# Patient Record
Sex: Male | Born: 1960 | Race: White | Hispanic: No | Marital: Married
Health system: Southern US, Community
[De-identification: ages and names within clinical notes are randomized; demographics above are authoritative.]

---

## 2009-08-29 ENCOUNTER — Encounter: Admission: RE | Admit: 2009-08-29 | Discharge: 2009-08-29 | Payer: Self-pay | Admitting: Family Medicine

## 2010-10-13 IMAGING — CR DG CHEST 2V
2 series · 2 of 2 positions shown · non-contrast
Comparison: None

CLINICAL DATA: Cough.

CHEST - 2 VIEW

[w chest pa]
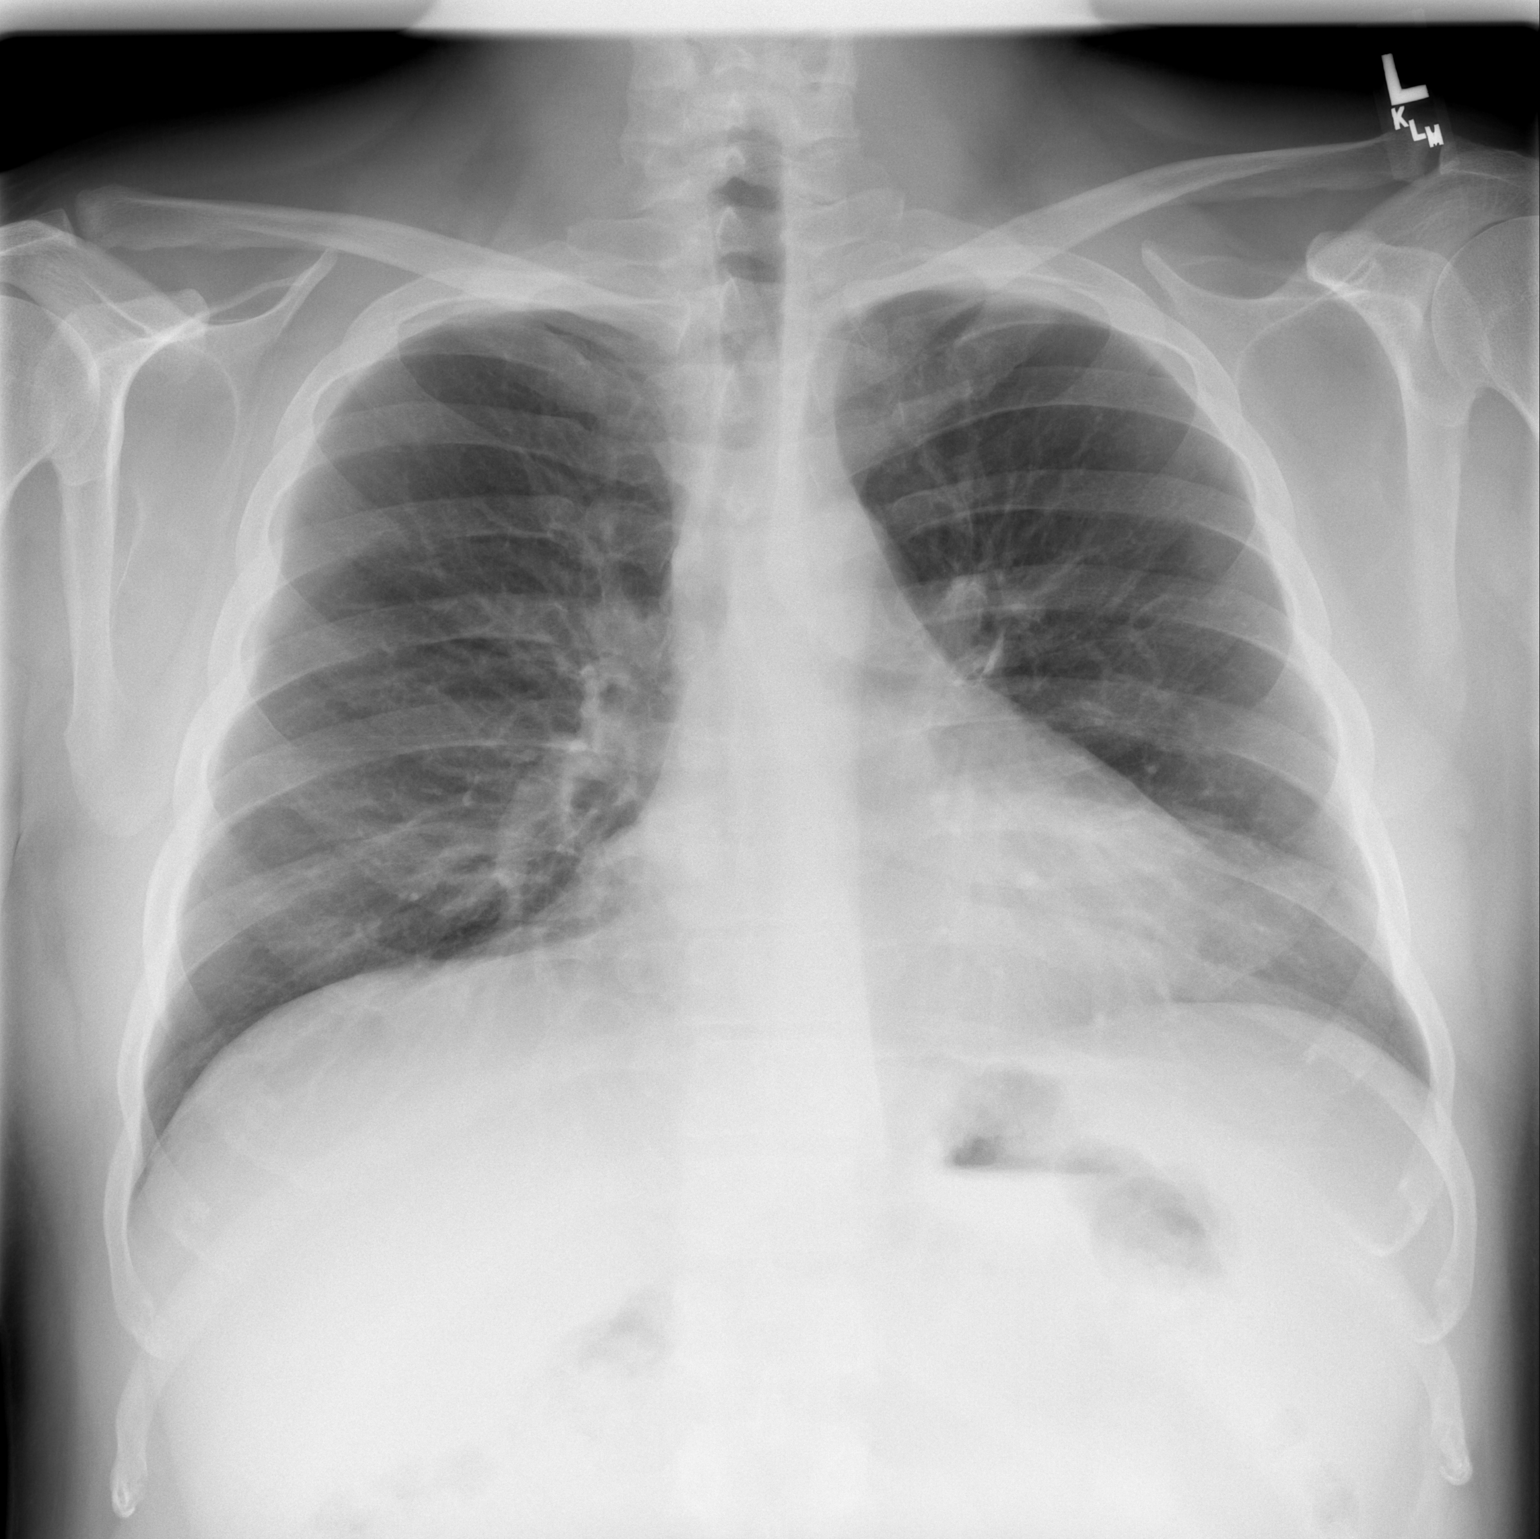

[w chest lat]
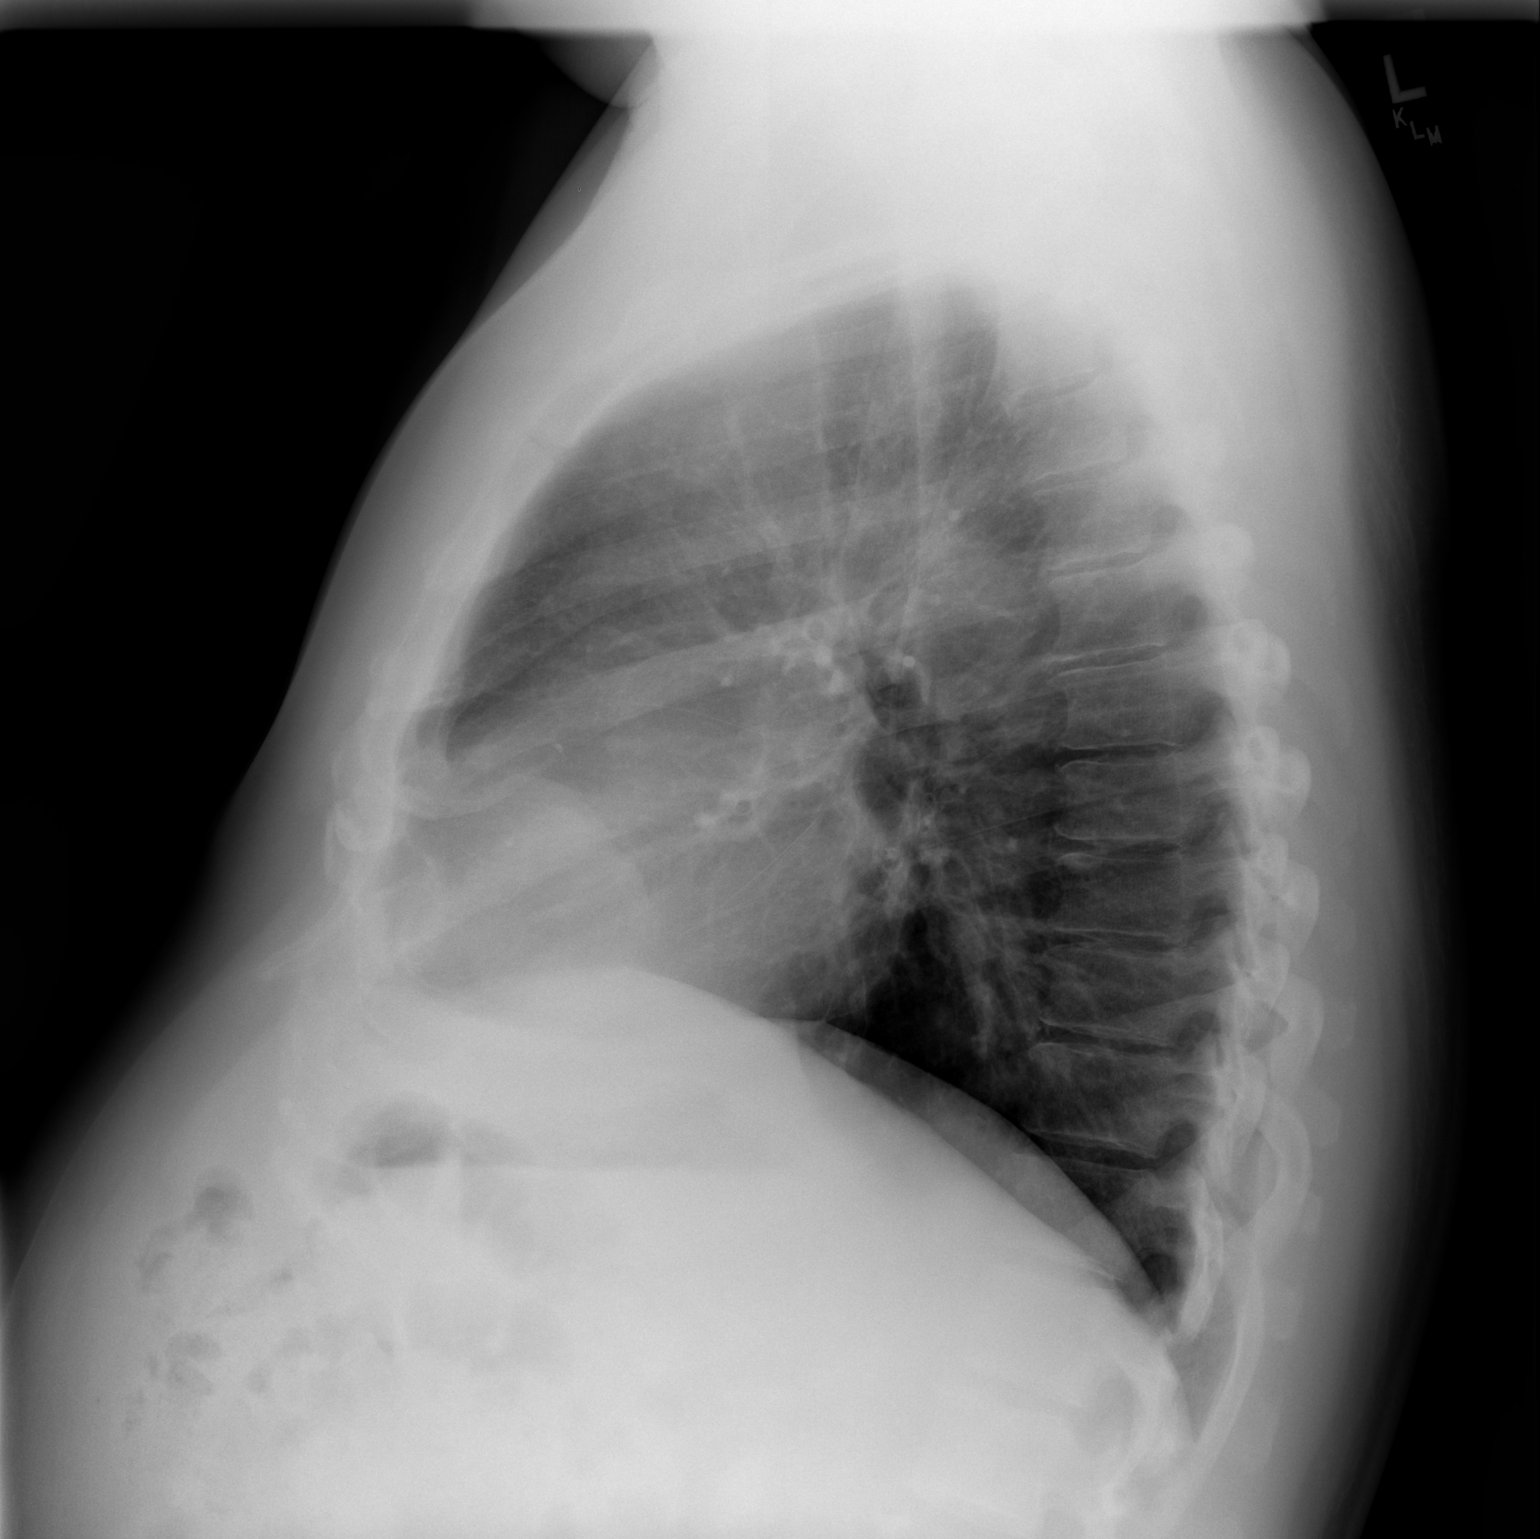

[2 of 2 positions shown; findings below may reference images not displayed]

FINDINGS: Heart is upper limits normal in size.  No confluent
airspace opacities in the lungs.  No effusions or acute bony
abnormality.
IMPRESSION: No acute cardiopulmonary disease.

## 2015-05-16 ENCOUNTER — Other Ambulatory Visit: Payer: Self-pay | Admitting: Family Medicine

## 2015-05-16 ENCOUNTER — Ambulatory Visit
Admission: RE | Admit: 2015-05-16 | Discharge: 2015-05-16 | Disposition: A | Discharge status: Home / Self Care | Payer: 59 | Source: Ambulatory Visit | Attending: Family Medicine | Admitting: Family Medicine

## 2015-05-16 DIAGNOSIS — R059 Cough, unspecified: Secondary | ICD-10-CM

## 2015-05-16 DIAGNOSIS — R05 Cough: Secondary | ICD-10-CM

## 2016-02-25 ENCOUNTER — Encounter: Payer: 59 | Admitting: Podiatry

## 2016-05-01 NOTE — Progress Notes (Signed)
This encounter was created in error - please disregard.

## 2016-06-29 IMAGING — CR DG CHEST 2V
2 series · 2 of 2 positions shown · non-contrast
Comparison: 08/29/2009

CLINICAL DATA: Cough, 2 months duration. Wheezing and chest
tightness.

EXAM:
CHEST  2 VIEW

[w chest pa]
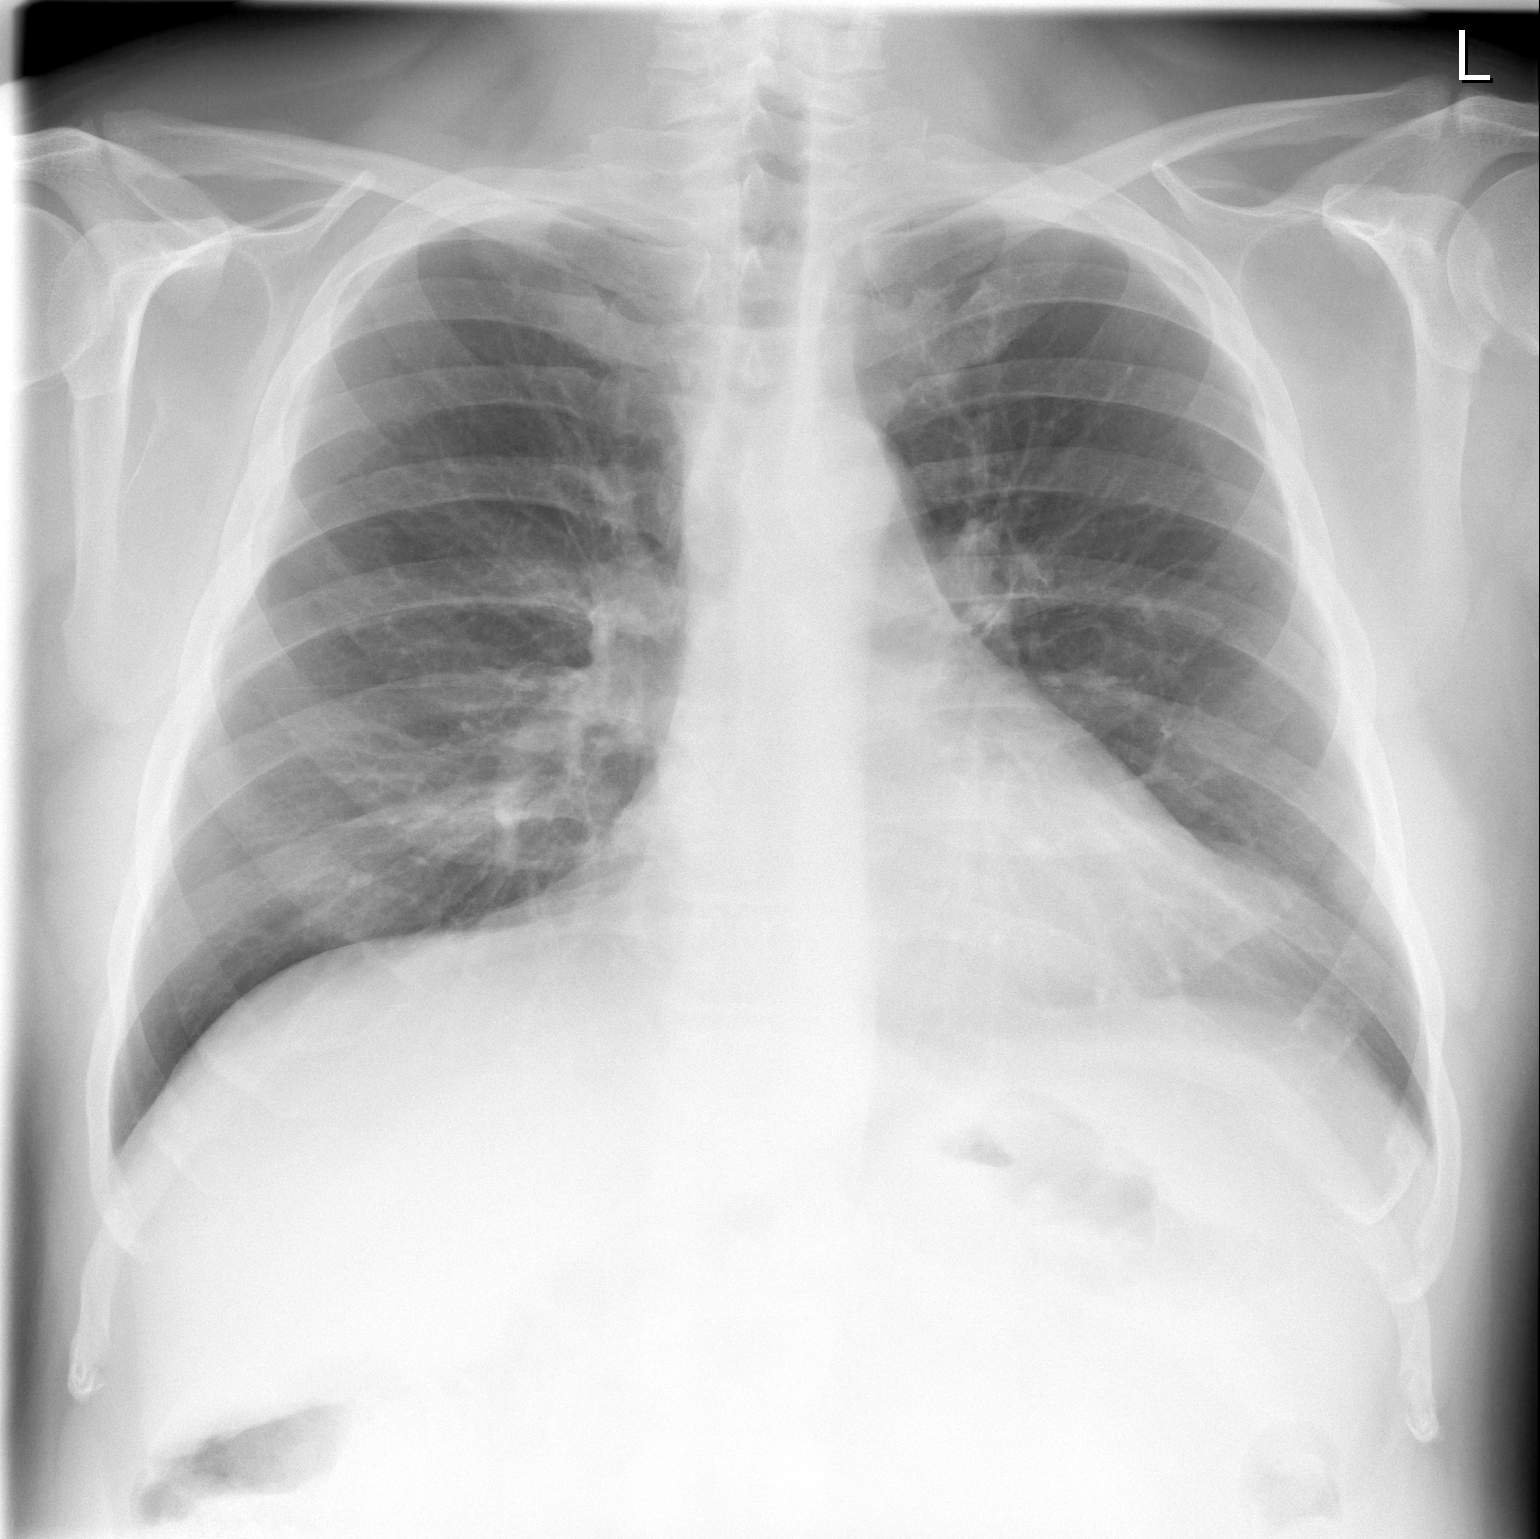

[w chest lat]
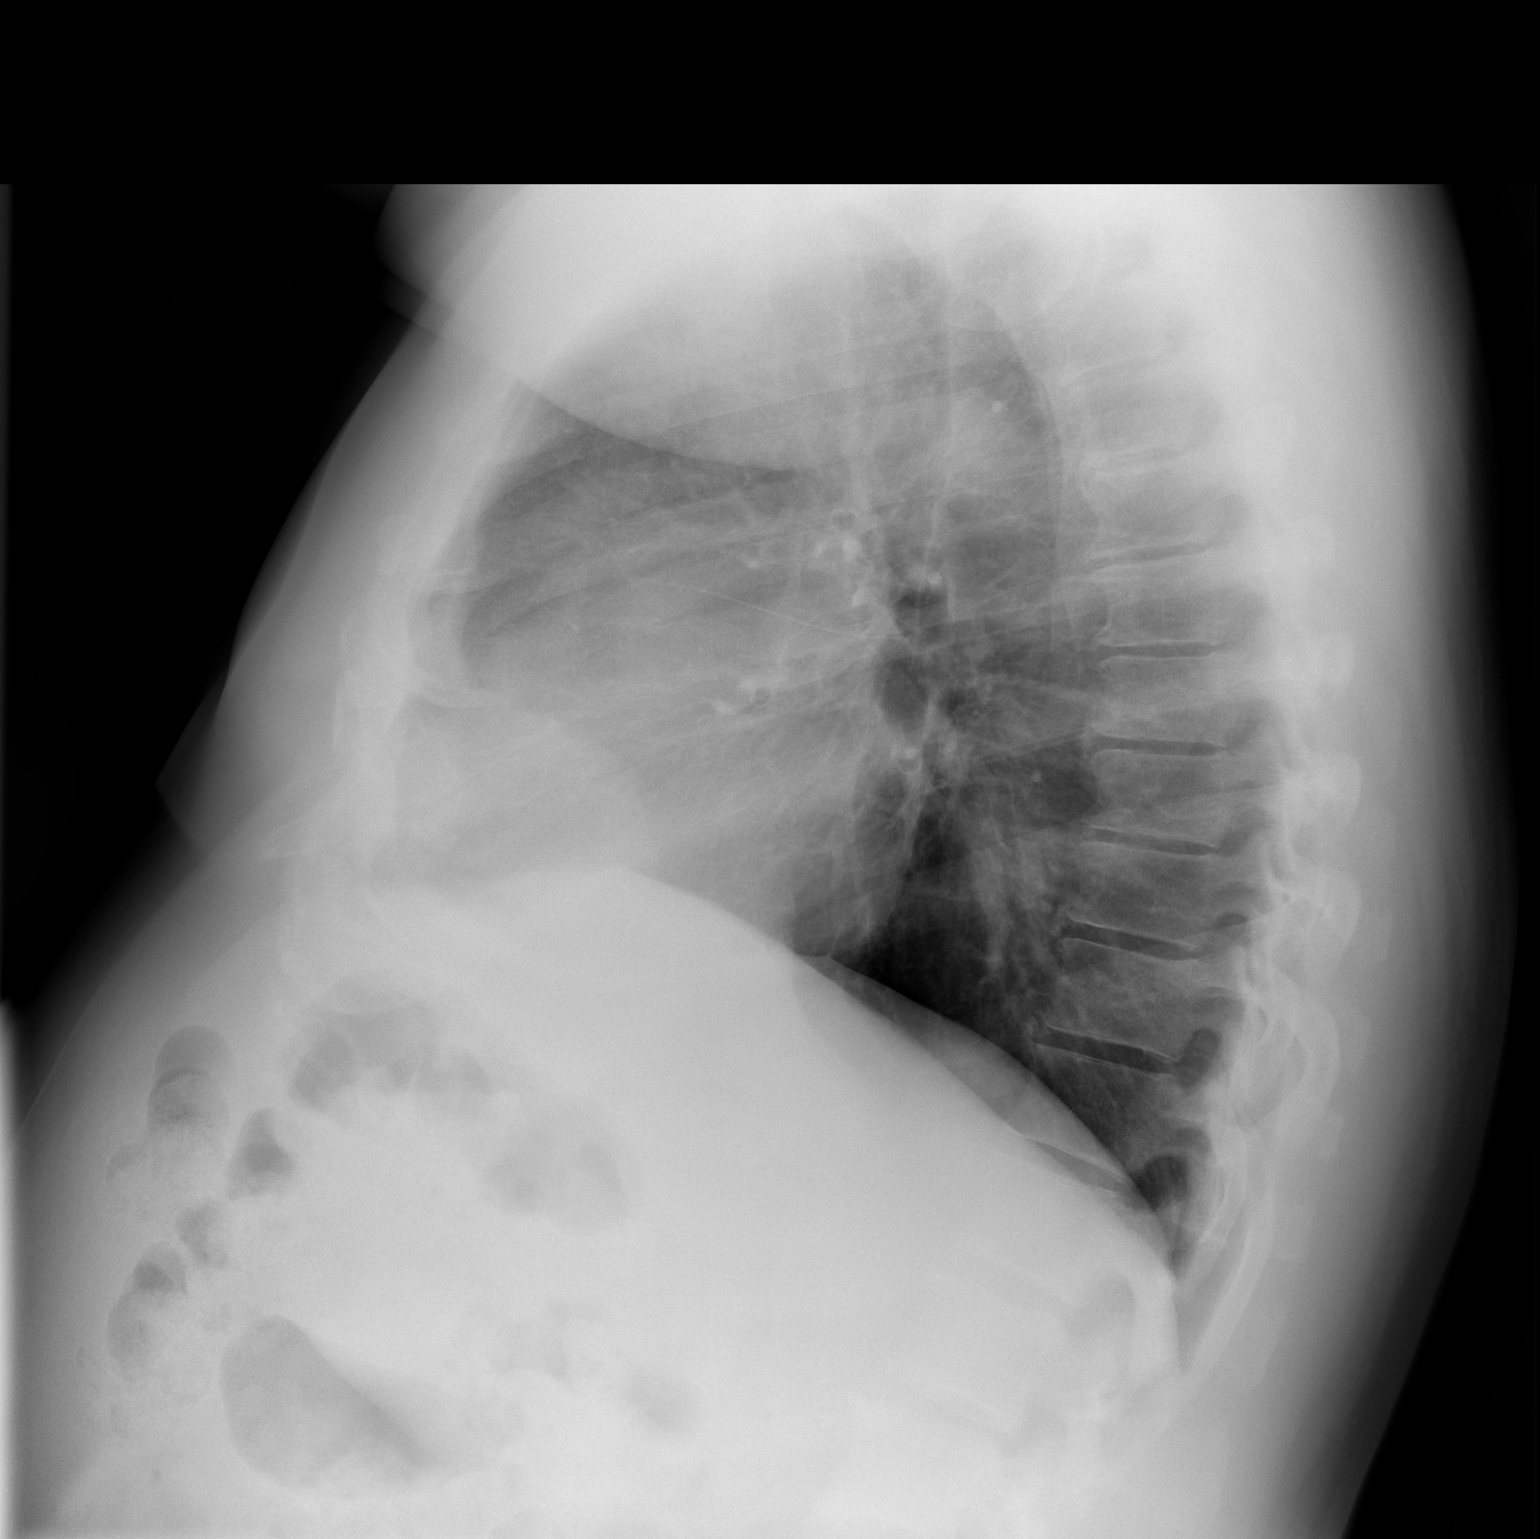

[2 of 2 positions shown; findings below may reference images not displayed]

FINDINGS: Heart size is normal. Mediastinal shadows are normal. The lungs are
clear. Epicardial fat pads unchanged. No evidence of objective
bronchial thickening, infiltrate, collapse or effusion. No bony
abnormality.
IMPRESSION: No active disease.  Normal chest.

## 2022-03-20 ENCOUNTER — Ambulatory Visit: Payer: 59 | Admitting: Podiatry

## 2022-03-20 ENCOUNTER — Encounter: Payer: Self-pay | Admitting: Podiatry

## 2022-03-20 ENCOUNTER — Ambulatory Visit (INDEPENDENT_AMBULATORY_CARE_PROVIDER_SITE_OTHER): Payer: 59

## 2022-03-20 DIAGNOSIS — M79672 Pain in left foot: Secondary | ICD-10-CM | POA: Diagnosis not present

## 2022-03-20 DIAGNOSIS — M7752 Other enthesopathy of left foot: Secondary | ICD-10-CM

## 2022-03-20 NOTE — Progress Notes (Signed)
  Subjective:  Patient ID: Alejandro Coleman, male    DOB: [birth year],   MRN: [id number]  Chief Complaint  Patient presents with   Toe Pain     foot pain at the top of foot at the toes     61 y.o. male presents for concern of left foot pain that has been going on for about a month. Relates the joints of the tops of his toes hurt. Relates he has a similar problem on the right and was seen by PCP and given pain medication which helped for that side.  . Denies any other pedal complaints. Denies n/v/f/c.   History reviewed. No pertinent past medical history.  Objective:  Physical Exam: Vascular: DP/PT pulses 2/4 bilateral. CFT <3 seconds. Normal hair growth on digits. No edema.  Skin. No lacerations or abrasions bilateral feet.  Musculoskeletal: MMT 5/5 bilateral lower extremities in DF, PF, Inversion and Eversion. Deceased ROM in DF of ankle joint. Tender to plantar first and second MPJ and pain with ROM of the joints. No pain in the interspaces and minimal pain in the third MPJ. No pain with metatarsal squeeze. No pain to medial eminence.  Neurological: Sensation intact to light touch.   Assessment:   1. Capsulitis of metatarsophalangeal (MTP) joint of left foot      Plan:  Patient was evaluated and treated and all questions answered. Discussed capsulitis and inflammation of joint and treatment options with patient.  Radiographs reviewed and discussed with patient. No acute fractures or dislocations.  Injection offered today. Patient deferred at this time.   Discussed stiff sole shoes and recommend use of metatarsal padding.   Discussed if pain does not improve may consider injection at that time and/or MRI for further surgical planning.  Patient to return in 6 weeks or sooner if concerns arise.    Louann Sjogren, DPM

## 2022-05-01 ENCOUNTER — Ambulatory Visit: Payer: 59 | Admitting: Podiatry
